# Patient Record
Sex: Female | Born: 2005 | Race: White | Hispanic: No | Marital: Single | Smoking: Never smoker
Health system: Southern US, Community
[De-identification: ages and names within clinical notes are randomized; demographics above are authoritative.]

## PROBLEM LIST (undated history)

## (undated) DIAGNOSIS — J45909 Unspecified asthma, uncomplicated: Secondary | ICD-10-CM

---
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
